# Patient Record
Sex: Male | Born: 1970 | Race: White | Hispanic: No | Marital: Married | Smoking: Current every day smoker
Health system: Southern US, Community
[De-identification: ages and names within clinical notes are randomized; demographics above are authoritative.]

## PROBLEM LIST (undated history)

## (undated) DIAGNOSIS — K2100 Gastro-esophageal reflux disease with esophagitis, without bleeding: Secondary | ICD-10-CM

---

## 1997-11-11 ENCOUNTER — Ambulatory Visit (HOSPITAL_COMMUNITY): Admission: RE | Admit: 1997-11-11 | Discharge: 1997-11-11 | Payer: Self-pay | Admitting: Internal Medicine

## 2008-01-28 ENCOUNTER — Emergency Department (HOSPITAL_BASED_OUTPATIENT_CLINIC_OR_DEPARTMENT_OTHER): Admission: EM | Admit: 2008-01-28 | Discharge: 2008-01-28 | Payer: Self-pay | Admitting: Emergency Medicine

## 2008-01-30 ENCOUNTER — Emergency Department (HOSPITAL_BASED_OUTPATIENT_CLINIC_OR_DEPARTMENT_OTHER): Admission: EM | Admit: 2008-01-30 | Discharge: 2008-01-30 | Payer: Self-pay | Admitting: Emergency Medicine

## 2009-08-09 IMAGING — CR DG NASAL BONES 3+V
3 series · 3 of 3 positions shown · non-contrast
Comparison: None

CLINICAL DATA: Hit in nose with baseball

NASAL BONES - 3+ VIEW

[w waters *]
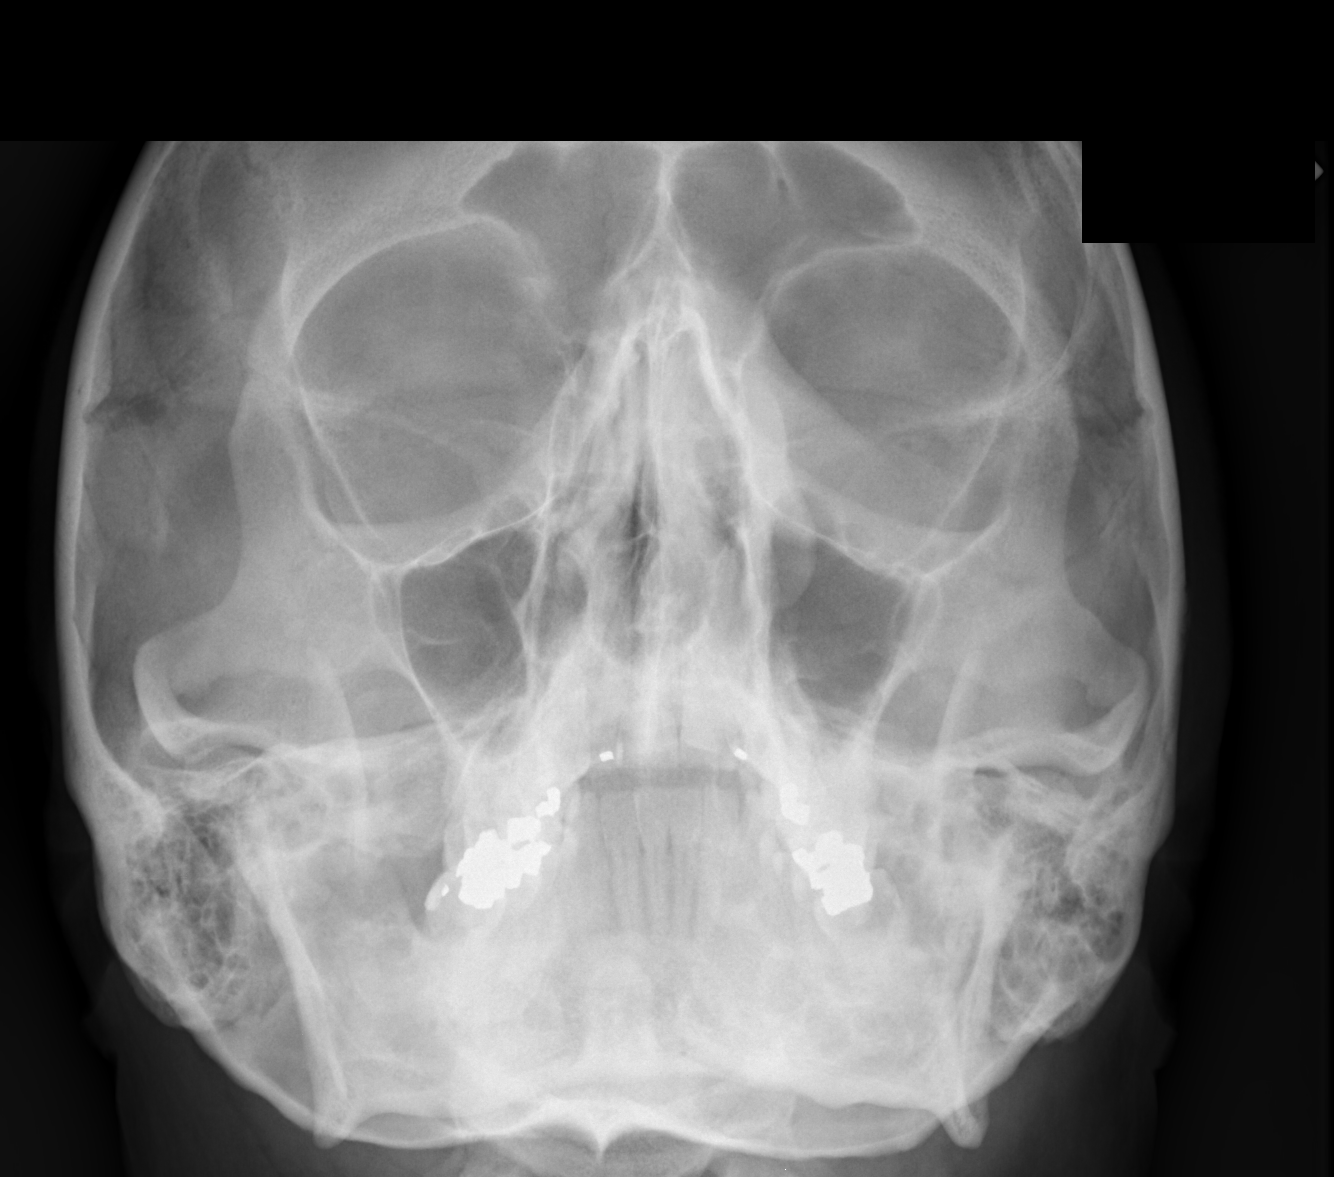

[w nasal bone lat * (1 of 2)]
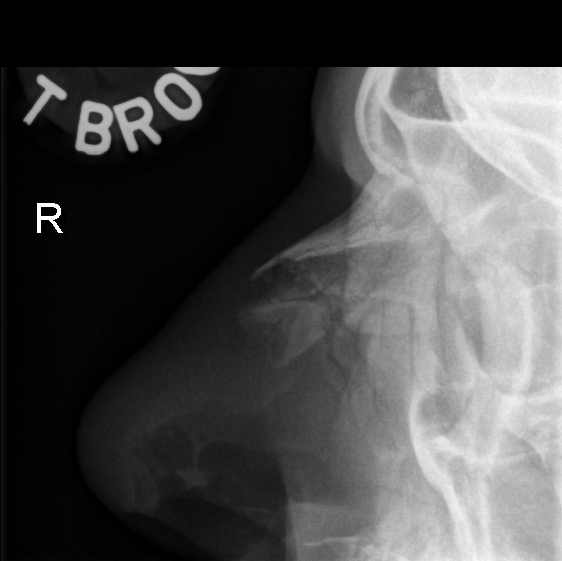

[w nasal bone lat * (2 of 2)]
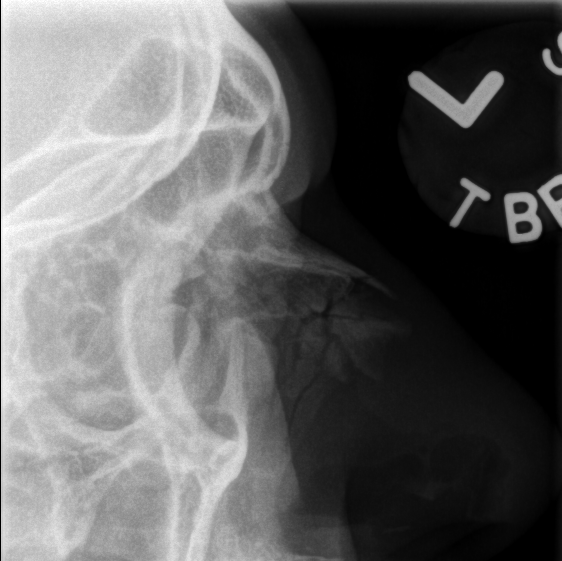

[3 of 3 positions shown; findings below may reference images not displayed]

FINDINGS: Linear lucency crosses the lower aspect of the nasal
bone, possibly nondisplaced fracture.  Paranasal sinuses are clear.
No evidence of orbital emphysema.
IMPRESSION: Possible nondisplaced nasal bone fracture.

## 2013-10-18 ENCOUNTER — Emergency Department (HOSPITAL_COMMUNITY): Payer: BC Managed Care – PPO

## 2013-10-18 ENCOUNTER — Encounter (HOSPITAL_COMMUNITY): Payer: Self-pay | Admitting: Emergency Medicine

## 2013-10-18 ENCOUNTER — Observation Stay (HOSPITAL_COMMUNITY)
Admission: EM | Admit: 2013-10-18 | Discharge: 2013-10-18 | Disposition: A | Discharge status: Home / Self Care | Payer: BC Managed Care – PPO | Attending: Internal Medicine | Admitting: Internal Medicine

## 2013-10-18 DIAGNOSIS — R110 Nausea: Secondary | ICD-10-CM | POA: Insufficient documentation

## 2013-10-18 DIAGNOSIS — R079 Chest pain, unspecified: Principal | ICD-10-CM | POA: Diagnosis present

## 2013-10-18 DIAGNOSIS — F17200 Nicotine dependence, unspecified, uncomplicated: Secondary | ICD-10-CM | POA: Insufficient documentation

## 2013-10-18 DIAGNOSIS — I4949 Other premature depolarization: Secondary | ICD-10-CM | POA: Insufficient documentation

## 2013-10-18 LAB — COMPREHENSIVE METABOLIC PANEL
ALT: 24 U/L (ref 0–53)
AST: 21 U/L (ref 0–37)
Albumin: 5.1 g/dL (ref 3.5–5.2)
Alkaline Phosphatase: 68 U/L (ref 39–117)
BUN: 12 mg/dL (ref 6–23)
CALCIUM: 10.1 mg/dL (ref 8.4–10.5)
CO2: 22 meq/L (ref 19–32)
CREATININE: 0.7 mg/dL (ref 0.50–1.35)
Chloride: 102 mEq/L (ref 96–112)
GFR calc Af Amer: >90 mL/min (ref >90)
Glucose, Bld: 120 mg/dL — ABNORMAL HIGH (ref 70–99)
Potassium: 4 mEq/L (ref 3.7–5.3)
Sodium: 140 mEq/L (ref 137–147)
Total Bilirubin: 0.4 mg/dL (ref 0.3–1.2)
Total Protein: 8 g/dL (ref 6.0–8.3)

## 2013-10-18 LAB — CBC WITH DIFFERENTIAL/PLATELET
Basophils Absolute: 0 10*3/uL (ref 0.0–0.1)
Basophils Relative: 0 % (ref 0–1)
EOS ABS: 0 10*3/uL (ref 0.0–0.7)
Eosinophils Relative: 0 % (ref 0–5)
HCT: 46.6 % (ref 39.0–52.0)
Hemoglobin: 16.5 g/dL (ref 13.0–17.0)
LYMPHS ABS: 2.3 10*3/uL (ref 0.7–4.0)
LYMPHS PCT: 15 % (ref 12–46)
MCH: 30.4 pg (ref 26.0–34.0)
MCHC: 35.4 g/dL (ref 30.0–36.0)
MCV: 85.8 fL (ref 78.0–100.0)
MONOS PCT: 4 % (ref 3–12)
Monocytes Absolute: 0.6 10*3/uL (ref 0.1–1.0)
NEUTROS ABS: 12.7 10*3/uL — AB (ref 1.7–7.7)
NEUTROS PCT: 81 % — AB (ref 43–77)
PLATELETS: 247 10*3/uL (ref 150–400)
RBC: 5.43 MIL/uL (ref 4.22–5.81)
RDW: 12.3 % (ref 11.5–15.5)
WBC: 15.7 10*3/uL — AB (ref 4.0–10.5)

## 2013-10-18 LAB — RAPID URINE DRUG SCREEN, HOSP PERFORMED
Amphetamines: NOT DETECTED
BENZODIAZEPINES: NOT DETECTED
Barbiturates: NOT DETECTED
COCAINE: NOT DETECTED
OPIATES: NOT DETECTED
Tetrahydrocannabinol: POSITIVE — AB

## 2013-10-18 LAB — TROPONIN I: Troponin I: <0.3 ng/mL (ref <0.30)

## 2013-10-18 LAB — D-DIMER, QUANTITATIVE (NOT AT ARMC)

## 2013-10-18 LAB — I-STAT TROPONIN, ED: TROPONIN I, POC: 0 ng/mL (ref 0.00–0.08)

## 2013-10-18 MED ORDER — FAMOTIDINE 20 MG PO TABS
20.0000 mg | ORAL_TABLET | Freq: Once | ORAL | Status: AC
  Administered 2013-10-18: 20 mg via ORAL
  Filled 2013-10-18: qty 1

## 2013-10-18 MED ORDER — ONDANSETRON HCL 4 MG/2ML IJ SOLN
4.0000 mg | Freq: Once | INTRAMUSCULAR | Status: AC
  Administered 2013-10-18: 4 mg via INTRAVENOUS
  Filled 2013-10-18: qty 2

## 2013-10-18 MED ORDER — GI COCKTAIL ~~LOC~~
30.0000 mL | Freq: Once | ORAL | Status: AC
  Administered 2013-10-18: 30 mL via ORAL
  Filled 2013-10-18: qty 30

## 2013-10-18 MED ORDER — SODIUM CHLORIDE 0.9 % IV BOLUS (SEPSIS)
500.0000 mL | Freq: Once | INTRAVENOUS | Status: AC
  Administered 2013-10-18: 500 mL via INTRAVENOUS

## 2013-10-18 NOTE — ED Notes (Signed)
PT refusing to be admitted. Dr Ardyth HarpsHernandez paged. Informed pt could leave AMA. Pt verbalized understanding of leaving against medical advice. Wife with patient.

## 2013-10-18 NOTE — ED Notes (Signed)
Pt states woke up this morning feeling like he couldn't catch his breath; developed chest pressure x 2 hours pta; worse with inspiration; vomited this morning

## 2013-10-18 NOTE — Progress Notes (Signed)
Went to do nursing admission history. Pt states he is not being admitted and wants to leave. Informed pt's rn. Briscoe Burns. Carleane Larkyn Greenberger BSN, RN-BC Admissions RN  10/18/2013 4:40 PM

## 2013-10-18 NOTE — ED Notes (Signed)
Pt currently at bedside vomiting.

## 2013-10-18 NOTE — Progress Notes (Signed)
   CARE MANAGEMENT ED NOTE 10/18/2013  Patient:  Michael Frazier,Michael Frazier   Account Number:  [id number]  Date Initiated:  10/18/2013  Documentation initiated by:  Edd ArbourGIBBS,Jennell Janosik  Subjective/Objective Assessment:   43 yr old bcbs [place] pt states seen by Dr Lucky CowboyWilliam mckeown     Subjective/Objective Assessment Detail:     Action[facility]Plan[facility]   [facility]   Action/Plan Detail:   Anticipated DC Date:       Status Recommendation to Physician:   Result of Recommendation:    Other ED Services  Consult Working Plan    DC Planning Services  Other  Outpatient Services - Pt will follow up  PCP issues    Choice offered to / List presented to:            Status of service:  Completed, signed off  ED Comments:   ED Comments Detail:

## 2013-10-18 NOTE — ED Provider Notes (Signed)
CSN: [id number]     Arrival date & time 10/18/13  1209 History   First MD Initiated Contact with Patient 10/18/13 1244     Chief Complaint  Patient presents with  . Chest Pain     (Consider location/radiation/quality/duration/timing/severity/associated sxs/prior Treatment) Patient is a 43 y.o. male presenting with chest pain. The history is provided by the patient.  Chest Pain Associated symptoms: cough   Associated symptoms: no abdominal pain, no back pain, no fever, no headache, no palpitations, no shortness of breath and not vomiting   pt awoke this am and felt pain mid chest, midline, lower sternal area. Constant. Dull. Non radiating. At times sl worse w full breath in or out. Denies heartburn. No specific exacerbating or alleviating factors. No change w position or activity level. +sob. No nv or diaphoresis. Denies hx same/similar symptoms in past. No other recent chest pain or discomfort even w exertion. No fam hx premature cad. Pt w rare non prod cough. Smoker. No hx htn, no high chol. No cocaine abuse. No fever or chills. Pt denies leg pain or swelling. No hx dvt or pe. No recent surgery, immobility, trauma.  Denies hx gerd. No hx gallstones.      History reviewed. No pertinent past medical history. History reviewed. No pertinent past surgical history. No family history on file. History  Substance Use Topics  . Smoking status: Current Every Day Smoker    Types: Cigarettes  . Smokeless tobacco: Not on file  . Alcohol Use: No    Review of Systems  Constitutional: Negative for fever and chills.  HENT: Negative for sore throat.   Eyes: Negative for redness.  Respiratory: Positive for cough. Negative for shortness of breath.   Cardiovascular: Positive for chest pain. Negative for palpitations and leg swelling.  Gastrointestinal: Negative for vomiting, abdominal pain and diarrhea.  Genitourinary: Negative for dysuria and flank pain.  Musculoskeletal: Negative for back pain and  neck pain.  Skin: Negative for rash.  Neurological: Negative for headaches.  Hematological: Does not bruise/bleed easily.  Psychiatric/Behavioral: Negative for confusion.      Allergies  Review of patient's allergies indicates no known allergies.  Home Medications  No current outpatient prescriptions on file. BP 135/86  Pulse 58  Temp(Src) 97.9 F (36.6 C) (Oral)  Resp 21  SpO2 100% Physical Exam  Nursing note and vitals reviewed. Constitutional: He is oriented to person, place, and time. He appears well-developed and well-nourished. No distress.  HENT:  Head: Atraumatic.  Eyes: Conjunctivae are normal. No scleral icterus.  Neck: Neck supple. No JVD present. No tracheal deviation present.  Cardiovascular: Normal rate, regular rhythm, normal heart sounds and intact distal pulses.  Exam reveals no gallop and no friction rub.   No murmur heard. Pulmonary/Chest: Effort normal and breath sounds normal. No accessory muscle usage. No respiratory distress. He exhibits no tenderness.  Abdominal: Soft. Bowel sounds are normal. He exhibits no distension and no mass. There is no tenderness. There is no rebound and no guarding.  Genitourinary:  No cva tenderness  Musculoskeletal: Normal range of motion. He exhibits no edema and no tenderness.  Neurological: He is alert and oriented to person, place, and time.  Skin: Skin is warm and dry.  Psychiatric: He has a normal mood and affect.    ED Course  Procedures (including critical care time)  Results for orders placed during the hospital encounter of 10/18/13  COMPREHENSIVE METABOLIC PANEL      Result Value Ref Range  Sodium 140  137 - 147 mEq/L   Potassium 4.0  3.7 - 5.3 mEq/L   Chloride 102  96 - 112 mEq/L   CO2 22  19 - 32 mEq/L   Glucose, Bld 120 (*) 70 - 99 mg/dL   BUN 12  6 - 23 mg/dL   Creatinine, Ser 1.61  0.50 - 1.35 mg/dL   Calcium 09.6  8.4 - 04.5 mg/dL   Total Protein 8.0  6.0 - 8.3 g/dL   Albumin 5.1  3.5 - 5.2  g/dL   AST 21  0 - 37 U/L   ALT 24  0 - 53 U/L   Alkaline Phosphatase 68  39 - 117 U/L   Total Bilirubin 0.4  0.3 - 1.2 mg/dL   GFR calc non Af Amer >90  >90 mL/min   GFR calc Af Amer >90  >90 mL/min  CBC WITH DIFFERENTIAL      Result Value Ref Range   WBC 15.7 (*) 4.0 - 10.5 K/uL   RBC 5.43  4.22 - 5.81 MIL/uL   Hemoglobin 16.5  13.0 - 17.0 g/dL   HCT 40.9  81.1 - 91.4 %   MCV 85.8  78.0 - 100.0 fL   MCH 30.4  26.0 - 34.0 pg   MCHC 35.4  30.0 - 36.0 g/dL   RDW 78.2  95.6 - 21.3 %   Platelets 247  150 - 400 K/uL   Neutrophils Relative % 81 (*) 43 - 77 %   Neutro Abs 12.7 (*) 1.7 - 7.7 K/uL   Lymphocytes Relative 15  12 - 46 %   Lymphs Abs 2.3  0.7 - 4.0 K/uL   Monocytes Relative 4  3 - 12 %   Monocytes Absolute 0.6  0.1 - 1.0 K/uL   Eosinophils Relative 0  0 - 5 %   Eosinophils Absolute 0.0  0.0 - 0.7 K/uL   Basophils Relative 0  0 - 1 %   Basophils Absolute 0.0  0.0 - 0.1 K/uL  D-DIMER, QUANTITATIVE      Result Value Ref Range   D-Dimer, Quant <0.27  0.00 - 0.48 ug/mL-FEU  URINE RAPID DRUG SCREEN (HOSP PERFORMED)      Result Value Ref Range   Opiates NONE DETECTED  NONE DETECTED   Cocaine NONE DETECTED  NONE DETECTED   Benzodiazepines NONE DETECTED  NONE DETECTED   Amphetamines NONE DETECTED  NONE DETECTED   Tetrahydrocannabinol POSITIVE (*) NONE DETECTED   Barbiturates NONE DETECTED  NONE DETECTED  TROPONIN I      Result Value Ref Range   Troponin I <0.30  <0.30 ng/mL  I-STAT TROPOININ, ED      Result Value Ref Range   Troponin i, poc 0.00  0.00 - 0.08 ng/mL   Comment 3            Dg Chest 2 View  10/18/2013   CLINICAL DATA:  Chest pain, history of tobacco use  EXAM: CHEST  2 VIEW  COMPARISON:  None.  FINDINGS: The lungs are adequately inflated. There is no focal infiltrate. The cardiac silhouette is normal in size. The pulmonary vascularity is not engorged. The mediastinum is normal in width. There is no pleural effusion or pneumothorax. The observed portions of the  bony thorax exhibit no acute abnormalities.  IMPRESSION: There is no evidence of pneumonia nor CHF or other active cardiopulmonary disease.   Electronically Signed   By: Christoher  [place]   On: 10/18/2013 13:35  EKG Interpretation   Date/Time:  Monday October 18 2013 12:13:31 EDT Ventricular Rate:  89 PR Interval:  155 QRS Duration: 93 QT Interval:  390 QTC Calculation: 474 R Axis:   -8 Text Interpretation:  Sinus arrhythmia Ventricular premature complex  Baseline wander in lead(s) III aVL No previous tracing Confirmed by Denton LankSTEINL   MD, Caryn BeeKEVIN ([id number]) on 10/18/2013 12:23:29 PM      MDM  Iv ns. Labs. Cxr. Ecg.  Reviewed nursing notes and prior charts for additional history.   0112 repeat ecg done, no acute st/t changes as compared to prior.  zofran for nausea. Gi cocktail and pepcid tried for symptom relief.  Awaiting labs.   No relief w gi meds. Intermittent cp/discomfort on recheck.  Repeat ecg without acute change from prior.  Repeat troponin pending.   Given new onset, recurrent cp, will admit.  Discussed w hospitalist, will do temp orders to observation, tele.   Recheck, pt afeb. Chest cta. abd soft nt. No chest wall tenderness.        Suzi RootsKevin E Garlon Tuggle, MD 10/18/13 [phone]

## 2015-04-30 IMAGING — CR DG CHEST 2V
2 series · 2 of 2 positions shown · non-contrast
Comparison: None.

CLINICAL DATA: Chest pain, history of tobacco use

EXAM:
CHEST  2 VIEW

[w chest pa]
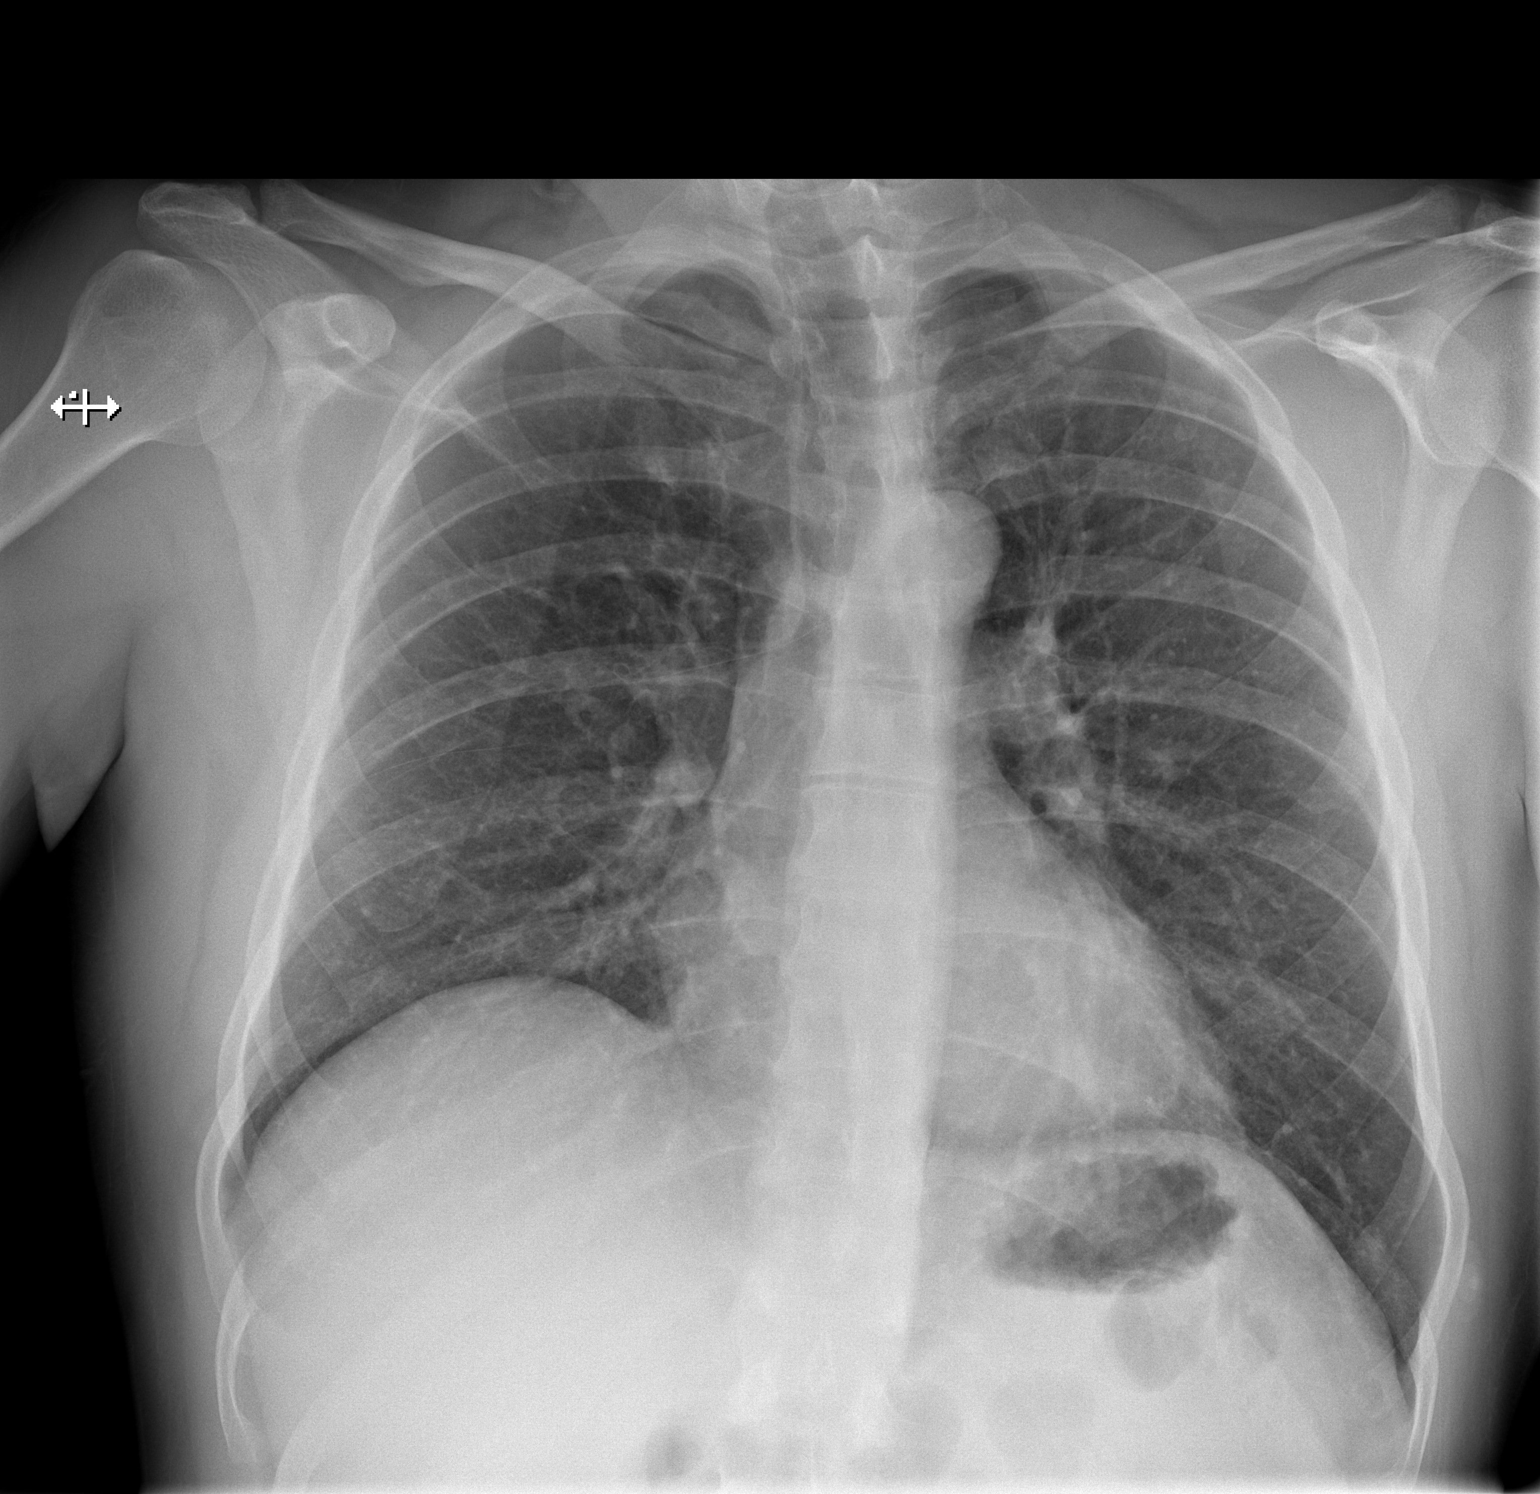

[w chest lat]
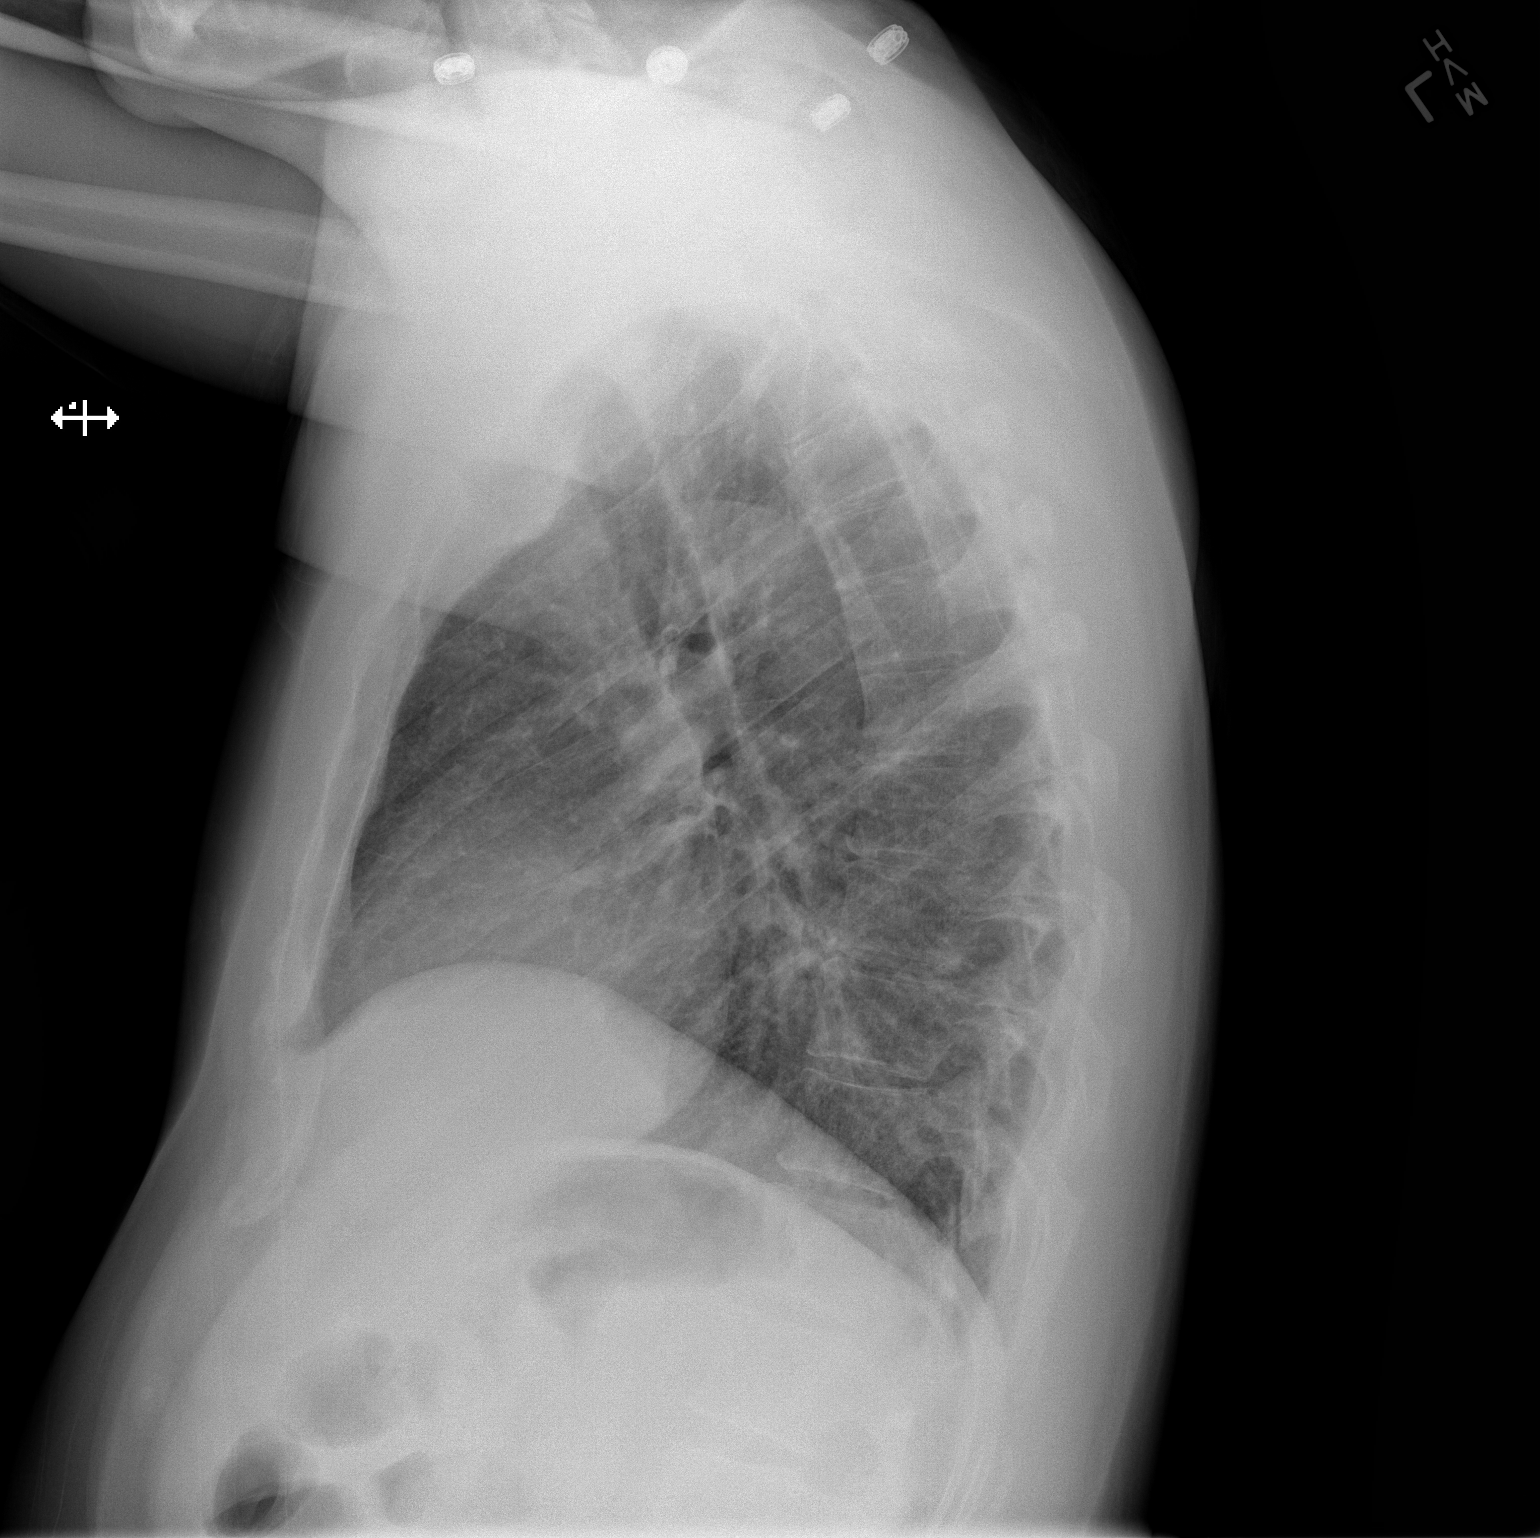

[2 of 2 positions shown; findings below may reference images not displayed]

FINDINGS: The lungs are adequately inflated. There is no focal infiltrate. The
cardiac silhouette is normal in size. The pulmonary vascularity is
not engorged. The mediastinum is normal in width. There is no
pleural effusion or pneumothorax. The observed portions of the bony
thorax exhibit no acute abnormalities.
IMPRESSION: There is no evidence of pneumonia nor CHF or other active
cardiopulmonary disease.

## 2017-08-24 NOTE — Progress Notes (Deleted)
Encounter to reestablish care and acute concern  Assessment and Plan:  Diagnoses and all orders for this visit:  Encounter for medical examination to establish care  Medication management -     CBC with Differential/Platelet -     BASIC METABOLIC PANEL WITH GFR -     Hepatic function panel -     Urinalysis w microscopic + reflex cultur     Discussed med's effects and SE's. Screening labs and tests as requested with regular follow-up as recommended. Over 30 minutes of exam, counseling, chart review and critical decision making was performed  Future Appointments  Date Time Provider Department Center  08/25/2017 11:00 AM Judd Gaudierorbett, Tasheem Elms, NP [facility] None     HPI There were no vitals taken for this visit.  47 year old male previously established at this practice returns after a 3 year hiatus to re-establish care and for concerns regarding a "lump" in his groin.   Needs CPE?  Current Medications:  No current outpatient medications on file prior to visit.   No current facility-administered medications on file prior to visit.    Allergies:  No Known Allergies Health Maintenance:   There is no immunization history on file for this patient.   Patient Care Team: Lucky CowboyMcKeown, William, MD as PCP - General (Internal Medicine)  Medical History:  has Chest pain on their problem list. Surgical History:  He  has no past surgical history on file. Family History:  His family history is not on file. Social History:   reports that he has been smoking cigarettes.  He does not have any smokeless tobacco history on file. He reports that he uses drugs. Drug: Marijuana. He reports that he does not drink alcohol. Review of Systems:  ROS  Physical Exam: There is no height or weight on file to calculate BMI. There were no vitals taken for this visit. General Appearance: Well nourished, in no apparent distress.  Eyes: PERRLA, EOMs, conjunctiva no swelling or erythema, normal fundi and  vessels.  Sinuses: No Frontal/maxillary tenderness  ENT/Mouth: Ext aud canals clear, normal light reflex with TMs without erythema, bulging. Good dentition. No erythema, swelling, or exudate on post pharynx. Tonsils not swollen or erythematous. Hearing normal.  Neck: Supple, thyroid normal. No bruits  Respiratory: Respiratory effort normal, BS equal bilaterally without rales, rhonchi, wheezing or stridor.  Cardio: RRR without murmurs, rubs or gallops. Brisk peripheral pulses without edema.  Chest: symmetric, with normal excursions and percussion.  Abdomen: Soft, nontender, no guarding, rebound, hernias, masses, or organomegaly.  Lymphatics: Non tender without lymphadenopathy.  Genitourinary:  Musculoskeletal: Full ROM all peripheral extremities,5/5 strength, and normal gait.  Skin: Warm, dry without rashes, lesions, ecchymosis. Neuro: Cranial nerves intact, reflexes equal bilaterally. Normal muscle tone, no cerebellar symptoms. Sensation intact.  Psych: Awake and oriented X 3, normal affect, Insight and Judgment appropriate.   Carlyon ShadowAshley C Rekha Hobbins 3:03 PM [facility]

## 2017-08-25 ENCOUNTER — Ambulatory Visit: Payer: Self-pay | Admitting: Adult Health

## 2017-08-31 NOTE — Progress Notes (Deleted)
Encounter to reestablish care and acute concern  Assessment and Plan:  Diagnoses and all orders for this visit:  Encounter to establish care with new doctor  Medication management     Discussed med's effects and SE's. Screening labs and tests as requested with regular follow-up as recommended. Over 30 minutes of exam, counseling, chart review and critical decision making was performed  Future Appointments  Date Time Provider Department Center  09/01/2017 11:15 AM Judd Gaudierorbett, Jerod Mcquain, NP [facility] None     HPI There were no vitals taken for this visit.  47 year old male previously established at this practice returns after a 3 year hiatus to re-establish care and for concerns regarding a "lump" in his groin.   Needs CPE? Cash pay?  Current Medications:  No current outpatient medications on file prior to visit.   No current facility-administered medications on file prior to visit.    Allergies:  No Known Allergies Health Maintenance:   There is no immunization history on file for this patient.   Patient Care Team: Lucky CowboyMcKeown, William, MD as PCP - General (Internal Medicine)  Medical History:  has Chest pain on their problem list. Surgical History:  He  has no past surgical history on file. Family History:  His family history is not on file. Social History:   reports that he has been smoking cigarettes.  He does not have any smokeless tobacco history on file. He reports that he uses drugs. Drug: Marijuana. He reports that he does not drink alcohol. Review of Systems:  Review of Systems  Constitutional: Negative for malaise/fatigue and weight loss.  HENT: Negative for hearing loss and tinnitus.   Eyes: Negative for blurred vision and double vision.  Respiratory: Negative for cough, shortness of breath and wheezing.   Cardiovascular: Negative for chest pain, palpitations, orthopnea, claudication and leg swelling.  Gastrointestinal: Negative for abdominal pain, blood in  stool, constipation, diarrhea, heartburn, melena, nausea and vomiting.  Genitourinary: Negative.   Musculoskeletal: Negative for joint pain and myalgias.  Skin: Negative for rash.  Neurological: Negative for dizziness, tingling, sensory change, weakness and headaches.  Endo/Heme/Allergies: Negative for polydipsia.  Psychiatric/Behavioral: Negative.   All other systems reviewed and are negative.   Physical Exam: There is no height or weight on file to calculate BMI. There were no vitals taken for this visit. General Appearance: Well nourished, in no apparent distress.  Eyes: PERRLA, EOMs, conjunctiva no swelling or erythema, normal fundi and vessels.  Sinuses: No Frontal/maxillary tenderness  ENT/Mouth: Ext aud canals clear, normal light reflex with TMs without erythema, bulging. Good dentition. No erythema, swelling, or exudate on post pharynx. Tonsils not swollen or erythematous. Hearing normal.  Neck: Supple, thyroid normal. No bruits  Respiratory: Respiratory effort normal, BS equal bilaterally without rales, rhonchi, wheezing or stridor.  Cardio: RRR without murmurs, rubs or gallops. Brisk peripheral pulses without edema.  Chest: symmetric, with normal excursions and percussion.  Abdomen: Soft, nontender, no guarding, rebound, hernias, masses, or organomegaly.  Lymphatics: Non tender without lymphadenopathy.  Genitourinary:  Musculoskeletal: Full ROM all peripheral extremities,5/5 strength, and normal gait.  Skin: Warm, dry without rashes, lesions, ecchymosis. Neuro: Cranial nerves intact, reflexes equal bilaterally. Normal muscle tone, no cerebellar symptoms. Sensation intact.  Psych: Awake and oriented X 3, normal affect, Insight and Judgment appropriate.   Carlyon ShadowAshley C Gwenlyn Hottinger 2:54 PM [facility]

## 2017-09-01 ENCOUNTER — Ambulatory Visit: Payer: Self-pay | Admitting: Adult Health

## 2019-08-01 ENCOUNTER — Other Ambulatory Visit: Payer: Self-pay

## 2019-08-01 ENCOUNTER — Encounter (HOSPITAL_BASED_OUTPATIENT_CLINIC_OR_DEPARTMENT_OTHER): Payer: Self-pay | Admitting: *Deleted

## 2019-08-01 ENCOUNTER — Emergency Department (HOSPITAL_BASED_OUTPATIENT_CLINIC_OR_DEPARTMENT_OTHER): Payer: BC Managed Care – PPO

## 2019-08-01 ENCOUNTER — Emergency Department (HOSPITAL_BASED_OUTPATIENT_CLINIC_OR_DEPARTMENT_OTHER)
Admission: EM | Admit: 2019-08-01 | Discharge: 2019-08-01 | Disposition: A | Discharge status: Home / Self Care | Payer: BC Managed Care – PPO | Attending: Emergency Medicine | Admitting: Emergency Medicine

## 2019-08-01 DIAGNOSIS — R140 Abdominal distension (gaseous): Secondary | ICD-10-CM | POA: Insufficient documentation

## 2019-08-01 DIAGNOSIS — R1013 Epigastric pain: Secondary | ICD-10-CM | POA: Insufficient documentation

## 2019-08-01 DIAGNOSIS — R079 Chest pain, unspecified: Secondary | ICD-10-CM | POA: Diagnosis present

## 2019-08-01 DIAGNOSIS — F17210 Nicotine dependence, cigarettes, uncomplicated: Secondary | ICD-10-CM | POA: Diagnosis not present

## 2019-08-01 DIAGNOSIS — R072 Precordial pain: Secondary | ICD-10-CM | POA: Diagnosis not present

## 2019-08-01 DIAGNOSIS — R142 Eructation: Secondary | ICD-10-CM | POA: Diagnosis not present

## 2019-08-01 DIAGNOSIS — R0602 Shortness of breath: Secondary | ICD-10-CM | POA: Insufficient documentation

## 2019-08-01 DIAGNOSIS — R112 Nausea with vomiting, unspecified: Secondary | ICD-10-CM | POA: Insufficient documentation

## 2019-08-01 LAB — CBC WITH DIFFERENTIAL/PLATELET
Abs Immature Granulocytes: 0.04 10*3/uL (ref 0.00–0.07)
Basophils Absolute: 0 10*3/uL (ref 0.0–0.1)
Basophils Relative: 0 %
Eosinophils Absolute: 0 10*3/uL (ref 0.0–0.5)
Eosinophils Relative: 0 %
HCT: 48.9 % (ref 39.0–52.0)
Hemoglobin: 16.7 g/dL (ref 13.0–17.0)
Immature Granulocytes: 0 %
Lymphocytes Relative: 14 %
Lymphs Abs: 1.8 10*3/uL (ref 0.7–4.0)
MCH: 30.5 pg (ref 26.0–34.0)
MCHC: 34.2 g/dL (ref 30.0–36.0)
MCV: 89.4 fL (ref 80.0–100.0)
Monocytes Absolute: 0.7 10*3/uL (ref 0.1–1.0)
Monocytes Relative: 5 %
Neutro Abs: 10.5 10*3/uL — ABNORMAL HIGH (ref 1.7–7.7)
Neutrophils Relative %: 81 %
Platelets: 262 10*3/uL (ref 150–400)
RBC: 5.47 MIL/uL (ref 4.22–5.81)
RDW: 12 % (ref 11.5–15.5)
WBC: 13 10*3/uL — ABNORMAL HIGH (ref 4.0–10.5)
nRBC: 0 % (ref 0.0–0.2)

## 2019-08-01 LAB — TROPONIN I (HIGH SENSITIVITY)
Troponin I (High Sensitivity): 3 ng/L (ref <18)
Troponin I (High Sensitivity): 3 ng/L (ref <18)

## 2019-08-01 LAB — BASIC METABOLIC PANEL
Anion gap: 14 (ref 5–15)
BUN: 16 mg/dL (ref 6–20)
CO2: 21 mmol/L — ABNORMAL LOW (ref 22–32)
Calcium: 9.4 mg/dL (ref 8.9–10.3)
Chloride: 100 mmol/L (ref 98–111)
Creatinine, Ser: 0.81 mg/dL (ref 0.61–1.24)
GFR calc Af Amer: >60 mL/min (ref >60)
GFR calc non Af Amer: >60 mL/min (ref >60)
Glucose, Bld: 142 mg/dL — ABNORMAL HIGH (ref 70–99)
Potassium: 3.4 mmol/L — ABNORMAL LOW (ref 3.5–5.1)
Sodium: 135 mmol/L (ref 135–145)

## 2019-08-01 LAB — D-DIMER, QUANTITATIVE: D-Dimer, Quant: <0.27 ug/mL-FEU (ref 0.00–0.50)

## 2019-08-01 MED ORDER — KETOROLAC TROMETHAMINE 30 MG/ML IJ SOLN
15.0000 mg | Freq: Once | INTRAMUSCULAR | Status: DC
  Filled 2019-08-01: qty 1

## 2019-08-01 MED ORDER — ASPIRIN 81 MG PO CHEW
324.0000 mg | CHEWABLE_TABLET | Freq: Once | ORAL | Status: AC
  Administered 2019-08-01: 324 mg via ORAL
  Filled 2019-08-01: qty 4

## 2019-08-01 MED ORDER — LIDOCAINE VISCOUS HCL 2 % MT SOLN
15.0000 mL | Freq: Once | OROMUCOSAL | Status: AC
  Administered 2019-08-01: 15 mL via ORAL
  Filled 2019-08-01: qty 15

## 2019-08-01 MED ORDER — ALUM & MAG HYDROXIDE-SIMETH 200-200-20 MG/5ML PO SUSP
30.0000 mL | Freq: Once | ORAL | Status: AC
  Administered 2019-08-01: 30 mL via ORAL
  Filled 2019-08-01: qty 30

## 2019-08-01 MED ORDER — OMEPRAZOLE 20 MG PO CPDR: 20.0000 mg | DELAYED_RELEASE_CAPSULE | Freq: Every day | ORAL | 0 refills | Status: AC

## 2019-08-01 MED ORDER — ONDANSETRON HCL 4 MG/2ML IJ SOLN
4.0000 mg | Freq: Once | INTRAMUSCULAR | Status: AC
  Administered 2019-08-01: 4 mg via INTRAVENOUS
  Filled 2019-08-01: qty 2

## 2019-08-01 NOTE — ED Notes (Signed)
Notified lab regarding ddimer order

## 2019-08-01 NOTE — ED Provider Notes (Signed)
[facility] EMERGENCY DEPARTMENT Provider Note   CSN: [id number] Arrival date & time: 08/01/19  0522     History Chief Complaint  Patient presents with  . Chest Pain    Michael Frazier is a 49 y.o. male.  The history is provided by the patient.  Chest Pain Pain location:  Epigastric Pain quality: not dull   Pain radiates to:  Does not radiate Pain severity:  Severe Onset quality:  Gradual Timing:  Intermittent Progression:  Worsening Chronicity:  New Context: at rest   Relieved by:  Nothing Worsened by:  Nothing Ineffective treatments:  None tried Associated symptoms: nausea and vomiting   Associated symptoms: no altered mental status, no anorexia, no back pain, no claudication, no cough, no diaphoresis, no dizziness, no dysphagia, no fatigue, no fever, no numbness and no palpitations   Associated symptoms comment:  Belching with nausea and vomiting and sob when episodes occur.  The current episode has been constant for 15 hours as it started at 2 pm post sausage biscuit.   Risk factors: male sex   Risk factors: no coronary artery disease, no diabetes mellitus, no high cholesterol, no hypertension, not obese, no prior DVT/PE and no surgery        History reviewed. No pertinent past medical history.  Patient Active Problem List   Diagnosis Date Noted  . Chest pain 10/18/2013    History reviewed. No pertinent surgical history.     History reviewed. No pertinent family history.  Social History   Tobacco Use  . Smoking status: Current Every Day Smoker    Types: Cigarettes  Substance Use Topics  . Alcohol use: No  . Drug use: Yes    Types: Marijuana    Home Medications Prior to Admission medications   Medication Sig Start Date End Date Taking? Authorizing Provider  omeprazole (PRILOSEC) 20 MG capsule Take 1 capsule (20 mg total) by mouth daily. 08/01/19   Jahleel Stroschein, MD    Allergies    Patient has no known allergies.  Review of Systems    Review of Systems  Constitutional: Negative for diaphoresis, fatigue and fever.  HENT: Negative for trouble swallowing.   Respiratory: Negative for cough.   Cardiovascular: Positive for chest pain. Negative for palpitations, claudication and leg swelling.  Gastrointestinal: Positive for nausea and vomiting. Negative for anorexia.       Belching   Genitourinary: Negative for difficulty urinating.  Musculoskeletal: Negative for back pain.  Neurological: Negative for dizziness and numbness.  Psychiatric/Behavioral: Negative for agitation.  All other systems reviewed and are negative.   Physical Exam Updated Vital Signs BP (!) 149/81   Pulse (!) 58   Temp 98.3 F (36.8 C) (Oral)   Resp 16   Ht 5\' 3"  (1.6 m)   Wt 72.6 kg   SpO2 100%   BMI 28.34 kg/m   Physical Exam Vitals and nursing note reviewed.  Constitutional:      General: He is not in acute distress.    Appearance: He is normal weight. He is not diaphoretic.  HENT:     Head: Normocephalic and atraumatic.     Nose: Nose normal.  Eyes:     Conjunctiva/sclera: Conjunctivae normal.     Pupils: Pupils are equal, round, and reactive to light.  Cardiovascular:     Rate and Rhythm: Normal rate and regular rhythm.     Pulses: Normal pulses.     Heart sounds: Normal heart sounds.  Pulmonary:  Effort: Pulmonary effort is normal.     Breath sounds: Normal breath sounds.  Abdominal:     General: Abdomen is flat.     Palpations: Abdomen is soft.     Tenderness: There is no abdominal tenderness. There is no guarding or rebound. Negative signs include Murphy's sign, McBurney's sign and psoas sign.     Comments: Gassy in the upper abdomen  Musculoskeletal:        General: Normal range of motion.     Cervical back: Normal range of motion and neck supple.  Skin:    General: Skin is warm and dry.     Capillary Refill: Capillary refill takes less than 2 seconds.  Neurological:     General: No focal deficit present.      Mental Status: He is alert and oriented to person, place, and time.     Deep Tendon Reflexes: Reflexes normal.  Psychiatric:        Mood and Affect: Mood normal.        Behavior: Behavior normal.     ED Results / Procedures / Treatments   Labs (all labs ordered are listed, but only abnormal results are displayed) Results for orders placed or performed during the hospital encounter of 08/01/19  CBC with Differential  Result Value Ref Range   WBC 13.0 (H) 4.0 - 10.5 K/uL   RBC 5.47 4.22 - 5.81 MIL/uL   Hemoglobin 16.7 13.0 - 17.0 g/dL   HCT 48.9 39.0 - 52.0 %   MCV 89.4 80.0 - 100.0 fL   MCH 30.5 26.0 - 34.0 pg   MCHC 34.2 30.0 - 36.0 g/dL   RDW 12.0 11.5 - 15.5 %   Platelets 262 150 - 400 K/uL   nRBC 0.0 0.0 - 0.2 %   Neutrophils Relative % 81 %   Neutro Abs 10.5 (H) 1.7 - 7.7 K/uL   Lymphocytes Relative 14 %   Lymphs Abs 1.8 0.7 - 4.0 K/uL   Monocytes Relative 5 %   Monocytes Absolute 0.7 0.1 - 1.0 K/uL   Eosinophils Relative 0 %   Eosinophils Absolute 0.0 0.0 - 0.5 K/uL   Basophils Relative 0 %   Basophils Absolute 0.0 0.0 - 0.1 K/uL   Immature Granulocytes 0 %   Abs Immature Granulocytes 0.04 0.00 - 0.07 K/uL  Basic metabolic panel  Result Value Ref Range   Sodium 135 135 - 145 mmol/L   Potassium 3.4 (L) 3.5 - 5.1 mmol/L   Chloride 100 98 - 111 mmol/L   CO2 21 (L) 22 - 32 mmol/L   Glucose, Bld 142 (H) 70 - 99 mg/dL   BUN 16 6 - 20 mg/dL   Creatinine, Ser 0.81 0.61 - 1.24 mg/dL   Calcium 9.4 8.9 - 10.3 mg/dL   GFR calc non Af Amer >60 >60 mL/min   GFR calc Af Amer >60 >60 mL/min   Anion gap 14 5 - 15  D-dimer, quantitative (not at [facility])  Result Value Ref Range   D-Dimer, Quant <0.27 0.00 - 0.50 ug/mL-FEU  Troponin I (High Sensitivity)  Result Value Ref Range   Troponin I (High Sensitivity) 3 <18 ng/L   DG Chest Portable 1 View  Result Date: 08/01/2019 CLINICAL DATA:  Substernal chest pain with shortness of breath. EXAM: PORTABLE CHEST 1 VIEW COMPARISON:   10/18/2013 FINDINGS: 0542 hours. The lungs are clear without focal pneumonia, edema, pneumothorax or pleural effusion. The cardiopericardial silhouette is within normal limits for size. The visualized bony  structures of the thorax are intact. Telemetry leads overlie the chest. IMPRESSION: No active disease. Electronically Signed   By: Kennith Center M.D.   On: 08/01/2019 06:07    EKG EKG Interpretation  Date/Time:  Sunday August 01 2019 05:50:10 EST Ventricular Rate:  63 PR Interval:    QRS Duration: 83 QT Interval:  402 QTC Calculation: 412 R Axis:   8 Text Interpretation: Sinus or ectopic atrial rhythm Short PR interval I Confirmed by Relda Agosto ([id number]) on 08/01/2019 6:27:19 AM   Radiology DG Chest Portable 1 View  Result Date: 08/01/2019 CLINICAL DATA:  Substernal chest pain with shortness of breath. EXAM: PORTABLE CHEST 1 VIEW COMPARISON:  10/18/2013 FINDINGS: 0542 hours. The lungs are clear without focal pneumonia, edema, pneumothorax or pleural effusion. The cardiopericardial silhouette is within normal limits for size. The visualized bony structures of the thorax are intact. Telemetry leads overlie the chest. IMPRESSION: No active disease. Electronically Signed   By: Eric  Mansell M.D.   On: 08/01/2019 06:07    Procedures Procedures (including critical care time)  Medications Ordered in ED Medications  ketorolac (TORADOL) 30 MG/ML injection 15 mg (has no administration in time range)  alum & mag hydroxide-simeth (MAALOX/MYLANTA) 200-200-20 MG/5ML suspension 30 mL (30 mLs Oral Given 08/01/19 0549)    And  lidocaine (XYLOCAINE) 2 % viscous mouth solution 15 mL (15 mLs Oral Given 08/01/19 0549)  aspirin chewable tablet 324 mg (324 mg Oral Given 08/01/19 0549)  ondansetron (ZOFRAN) injection 4 mg (4 mg Intravenous Given 08/01/19 0549)    ED Course  I have reviewed the triage vital signs and the nursing notes.  Pertinent labs & imaging results that were available during my care  of the patient were reviewed by me and considered in my medical decision making (see chart for details).    Sleeping post medication.  I suspect this is GERD and have sent an RX to the pharmacy and have advised a GERD friendly diet.  Low risk for PE and ruled out with negative Ddimer.  Signed out to Dr. Zackowski pending second troponin.  Heart score is 1  Final Clinical Impression(s) / ED Diagnoses Final diagnoses:  Precordial pain   Signed out pending second troponin.  Refer to cardiology for stress test as an outpatient.    Rx / DC Orders ED Discharge Orders         Ordered    omeprazole (PRILOSEC) 20 MG capsule  Daily     01 /10/21 0628           Narcisa Ganesh, MD 08/01/19 09/29/19

## 2019-08-01 NOTE — Discharge Instructions (Addendum)
If you develop recurrent, continued, or worsening chest pain, shortness of breath, fever, vomiting, abdominal or back pain, or any other new/concerning symptoms then return to the ER for evaluation.  

## 2019-08-01 NOTE — ED Notes (Signed)
X RAY at bedside 

## 2019-08-01 NOTE — ED Triage Notes (Signed)
Pt reports substernal CP x 1 week with SOB, N/V.

## 2019-08-01 NOTE — ED Provider Notes (Signed)
Second troponin is negative.  Patient feels well.  Appears stable for discharge home.  Referred to cardiology.   Pricilla Loveless, MD 08/01/19 [phone]

## 2019-08-02 ENCOUNTER — Encounter (HOSPITAL_BASED_OUTPATIENT_CLINIC_OR_DEPARTMENT_OTHER): Payer: Self-pay | Admitting: Emergency Medicine

## 2019-08-02 ENCOUNTER — Emergency Department (HOSPITAL_BASED_OUTPATIENT_CLINIC_OR_DEPARTMENT_OTHER)
Admission: EM | Admit: 2019-08-02 | Discharge: 2019-08-02 | Disposition: A | Discharge status: Home / Self Care | Payer: BC Managed Care – PPO | Attending: Emergency Medicine | Admitting: Emergency Medicine

## 2019-08-02 DIAGNOSIS — F1210 Cannabis abuse, uncomplicated: Secondary | ICD-10-CM | POA: Insufficient documentation

## 2019-08-02 DIAGNOSIS — R110 Nausea: Secondary | ICD-10-CM | POA: Insufficient documentation

## 2019-08-02 DIAGNOSIS — R0789 Other chest pain: Secondary | ICD-10-CM | POA: Diagnosis present

## 2019-08-02 DIAGNOSIS — R142 Eructation: Secondary | ICD-10-CM | POA: Insufficient documentation

## 2019-08-02 DIAGNOSIS — R0602 Shortness of breath: Secondary | ICD-10-CM | POA: Insufficient documentation

## 2019-08-02 DIAGNOSIS — F17210 Nicotine dependence, cigarettes, uncomplicated: Secondary | ICD-10-CM | POA: Insufficient documentation

## 2019-08-02 DIAGNOSIS — K219 Gastro-esophageal reflux disease without esophagitis: Secondary | ICD-10-CM | POA: Diagnosis not present

## 2019-08-02 HISTORY — DX: Gastro-esophageal reflux disease with esophagitis, without bleeding: K21.00

## 2019-08-02 MED ORDER — SUCRALFATE 1 G PO TABS
1.0000 g | ORAL_TABLET | Freq: Once | ORAL | Status: AC
  Administered 2019-08-02: 1 g via ORAL
  Filled 2019-08-02: qty 1

## 2019-08-02 MED ORDER — ONDANSETRON 4 MG PO TBDP
4.0000 mg | ORAL_TABLET | Freq: Once | ORAL | Status: AC
  Administered 2019-08-02: 4 mg via ORAL
  Filled 2019-08-02: qty 1

## 2019-08-02 MED ORDER — ONDANSETRON 4 MG PO TBDP: 4.0000 mg | ORAL_TABLET | Freq: Three times a day (TID) | ORAL | 0 refills | Status: AC | PRN

## 2019-08-02 MED ORDER — SUCRALFATE 1 GM/10ML PO SUSP: 1.0000 g | Freq: Three times a day (TID) | ORAL | 0 refills | Status: AC

## 2019-08-02 MED ORDER — SUCRALFATE 1 GM/10ML PO SUSP
ORAL | Status: AC
  Filled 2019-08-02: qty 10

## 2019-08-02 MED ORDER — LIDOCAINE VISCOUS HCL 2 % MT SOLN
15.0000 mL | Freq: Once | OROMUCOSAL | Status: AC
  Administered 2019-08-02: 15 mL via ORAL
  Filled 2019-08-02: qty 15

## 2019-08-02 MED ORDER — ALUM & MAG HYDROXIDE-SIMETH 200-200-20 MG/5ML PO SUSP
15.0000 mL | Freq: Once | ORAL | Status: AC
  Administered 2019-08-02: 15 mL via ORAL
  Filled 2019-08-02: qty 30

## 2019-08-02 NOTE — ED Notes (Signed)
Received report

## 2019-08-02 NOTE — ED Notes (Signed)
ED Provider at bedside. 

## 2019-08-02 NOTE — Discharge Instructions (Addendum)
You were seen today for atypical chest pain.  Your work-up yesterday was very reassuring.  Today your EKG is unchanged.  Your symptoms are highly suggestive of reflux.  For the next 2 to 3 days, avoid eliciting food.  Try to set up upright and sleep propped up.  Take medications as prescribed.  Follow-up with gastroenterologist for formal endoscopy.

## 2019-08-02 NOTE — ED Provider Notes (Signed)
[facility] EMERGENCY DEPARTMENT Provider Note   CSN: [id number] Arrival date & time: 08/02/19  0358     History Chief Complaint  Patient presents with  . Shortness of Breath    Michael Frazier is a 49 y.o. male.  HPI     This is a 49 year old male with no reported past medical history who presents with persistent burning chest pain, shortness of breath, burping, and nausea.  He was seen and evaluated yesterday for the same.  At that time he had a full work-up including cardiac enzymes, EKG, chest x-ray, and D-dimer.  I have reviewed his work-up and it was negative.  He was prescribed Prilosec.  Patient reports he has had persistent symptoms at home.  It is worse when he lays flat and at night.  It is a burning sensation in his mid chest that does not radiate.  He reports associated burping and nausea.  No vomiting or diarrhea.  Denies abdominal pain.  Has noted some shortness of breath.  No recent fevers or cough.  He is a smoker.  Currently he rates his pain at 8 out of 10.  Past Medical History:  Diagnosis Date  . Reflux esophagitis     Patient Active Problem List   Diagnosis Date Noted  . Chest pain 10/18/2013    History reviewed. No pertinent surgical history.     No family history on file.  Social History   Tobacco Use  . Smoking status: Current Every Day Smoker    Types: Cigarettes  . Smokeless tobacco: Never Used  Substance Use Topics  . Alcohol use: No  . Drug use: Yes    Types: Marijuana    Home Medications Prior to Admission medications   Medication Sig Start Date End Date Taking? Authorizing Provider  acetaminophen (TYLENOL) 325 MG tablet Take by mouth.    [provider]  omeprazole (PRILOSEC) 20 MG capsule Take 1 capsule (20 mg total) by mouth daily. 08/01/19   Palumbo, April, MD  ondansetron (ZOFRAN ODT) 4 MG disintegrating tablet Take 1 tablet (4 mg total) by mouth every 8 (eight) hours as needed. 08/02/19   Montae Stager, Mayer Masker,  MD  sucralfate (CARAFATE) 1 GM/10ML suspension Take 10 mLs (1 g total) by mouth 4 (four) times daily -  with meals and at bedtime. 08/02/19   Darika Ildefonso, Mayer Masker, MD    Allergies    Patient has no known allergies.  Review of Systems   Review of Systems  Constitutional: Negative for fever.  Respiratory: Positive for shortness of breath. Negative for cough.   Cardiovascular: Positive for chest pain. Negative for leg swelling.  Gastrointestinal: Positive for nausea. Negative for abdominal pain, diarrhea and vomiting.  Genitourinary: Negative for dysuria.  Musculoskeletal: Negative for back pain.  All other systems reviewed and are negative.   Physical Exam Updated Vital Signs BP 134/87   Pulse (!) 58   Temp 97.8 F (36.6 C) (Oral)   Resp 12   SpO2 97%   Physical Exam Vitals and nursing note reviewed.  Constitutional:      Appearance: He is well-developed. He is not ill-appearing.  HENT:     Head: Normocephalic and atraumatic.  Eyes:     Pupils: Pupils are equal, round, and reactive to light.  Cardiovascular:     Rate and Rhythm: Normal rate and regular rhythm.     Heart sounds: Normal heart sounds. No murmur.  Pulmonary:     Effort: Pulmonary effort is  normal. No respiratory distress.     Breath sounds: Normal breath sounds. No wheezing.  Chest:     Chest wall: No tenderness.  Abdominal:     General: Bowel sounds are normal.     Palpations: Abdomen is soft.     Tenderness: There is no abdominal tenderness. There is no rebound.  Musculoskeletal:     Cervical back: Neck supple.     Right lower leg: No tenderness. No edema.     Left lower leg: No tenderness. No edema.  Lymphadenopathy:     Cervical: No cervical adenopathy.  Skin:    General: Skin is warm and dry.  Neurological:     Mental Status: He is alert and oriented to person, place, and time.  Psychiatric:        Mood and Affect: Mood normal.     ED Results / Procedures / Treatments   Labs (all labs  ordered are listed, but only abnormal results are displayed) Labs Reviewed - No data to display  EKG EKG Interpretation  Date/Time:  Monday August 02 2019 04:28:25 EST Ventricular Rate:  57 PR Interval:    QRS Duration: 86 QT Interval:  415 QTC Calculation: 404 R Axis:   47 Text Interpretation: Sinus rhythm Probable left atrial enlargement No significant change since last tracing Confirmed by Ross Marcus ([id number]) on 08/02/2019 4:33:41 AM   Radiology DG Chest Portable 1 View  Result Date: 08/01/2019 CLINICAL DATA:  Substernal chest pain with shortness of breath. EXAM: PORTABLE CHEST 1 VIEW COMPARISON:  10/18/2013 FINDINGS: 0542 hours. The lungs are clear without focal pneumonia, edema, pneumothorax or pleural effusion. The cardiopericardial silhouette is within normal limits for size. The visualized bony structures of the thorax are intact. Telemetry leads overlie the chest. IMPRESSION: No active disease. Electronically Signed   By: Kennith Center M.D.   On: 08/01/2019 06:07    Procedures Procedures (including critical care time)  Medications Ordered in ED Medications  alum & mag hydroxide-simeth (MAALOX/MYLANTA) 200-200-20 MG/5ML suspension 15 mL (15 mLs Oral Given 08/02/19 0433)  ondansetron (ZOFRAN-ODT) disintegrating tablet 4 mg (4 mg Oral Given 08/02/19 0504)  alum & mag hydroxide-simeth (MAALOX/MYLANTA) 200-200-20 MG/5ML suspension 15 mL (15 mLs Oral Given 08/02/19 0505)    And  lidocaine (XYLOCAINE) 2 % viscous mouth solution 15 mL (15 mLs Oral Given 08/02/19 0505)  sucralfate (CARAFATE) tablet 1 g (1 g Oral Given 08/02/19 0505)  sucralfate (CARAFATE) 1 GM/10ML suspension (  Given 08/02/19 0505)    ED Course  I have reviewed the triage vital signs and the nursing notes.  Pertinent labs & imaging results that were available during my care of the patient were reviewed by me and considered in my medical decision making (see chart for details).    MDM Rules/Calculators/A&P                        Patient presents with atypical chest pain, nausea, shortness of breath.  He is overall nontoxic.  Vital signs are reassuring.  He is afebrile.  Full review of his work-up yesterday with no evidence of ACS, PE.  Repeat EKG without evidence of acute ischemia and unchanged from prior.  History is highly suggestive of reflux especially given positional component, description, and associated nausea and burping.  Patient was given Maalox and Zofran initially.  We will hold off on further work-up pending his relief.  Patient was subsequently given viscous lidocaine, Carafate.  On recheck, he reports no  real change but no worsening.  He is able to tolerate fluids.  His abdomen is nontender.  Low suspicion for cholecystitis or pancreatitis.  Will recommend further supportive measures for reflux including addition of Carafate and Zofran.  Close GI follow-up recommended.  Patient was given strict return precautions.  After history, exam, and medical workup I feel the patient has been appropriately medically screened and is safe for discharge home. Pertinent diagnoses were discussed with the patient. Patient was given return precautions.   Final Clinical Impression(s) / ED Diagnoses Final diagnoses:  Atypical chest pain  Gastroesophageal reflux disease, unspecified whether esophagitis present    Rx / DC Orders ED Discharge Orders         Ordered    ondansetron (ZOFRAN ODT) 4 MG disintegrating tablet  Every 8 hours PRN     08/02/19 0540    sucralfate (CARAFATE) 1 GM/10ML suspension  3 times daily with meals & bedtime     08/02/19 0540           Halia Franey, Barbette Hair, MD 08/02/19 [phone]

## 2019-08-02 NOTE — ED Triage Notes (Signed)
Pt seen last night for same sx states shortness of breath , nauseated. Amb no distress noted. Vss.

## 2020-03-24 ENCOUNTER — Other Ambulatory Visit: Payer: BC Managed Care – PPO

## 2020-03-24 ENCOUNTER — Other Ambulatory Visit: Payer: Self-pay

## 2020-03-24 DIAGNOSIS — Z20822 Contact with and (suspected) exposure to covid-19: Secondary | ICD-10-CM

## 2020-03-25 LAB — NOVEL CORONAVIRUS, NAA: SARS-CoV-2, NAA: DETECTED — AB

## 2020-03-27 ENCOUNTER — Telehealth: Payer: Self-pay | Admitting: Nurse Practitioner

## 2020-03-27 NOTE — Telephone Encounter (Signed)
Called to discuss with Michael Frazier about Covid symptoms and the use of casirivimab/imdevimab, a combination monoclonal antibody infusion for those with mild to moderate Covid symptoms and at a high risk of hospitalization.     Pt is qualified for this infusion at the [facility] infusion center due to co-morbid conditions (as indicated below)  and/or a member of an at-risk group.   Unable to reach. Mychart message sent and voicemail left.    Patient Active Problem List   Diagnosis Date Noted  . Chest pain 10/18/2013    Willette Alma, AGPCNP-BC

## 2021-02-10 IMAGING — DX DG CHEST 1V PORT
1 series · 1 of 1 positions shown · non-contrast
Comparison: 10/18/2013

CLINICAL DATA: Substernal chest pain with shortness of breath.

EXAM:
PORTABLE CHEST 1 VIEW

[chest ap]
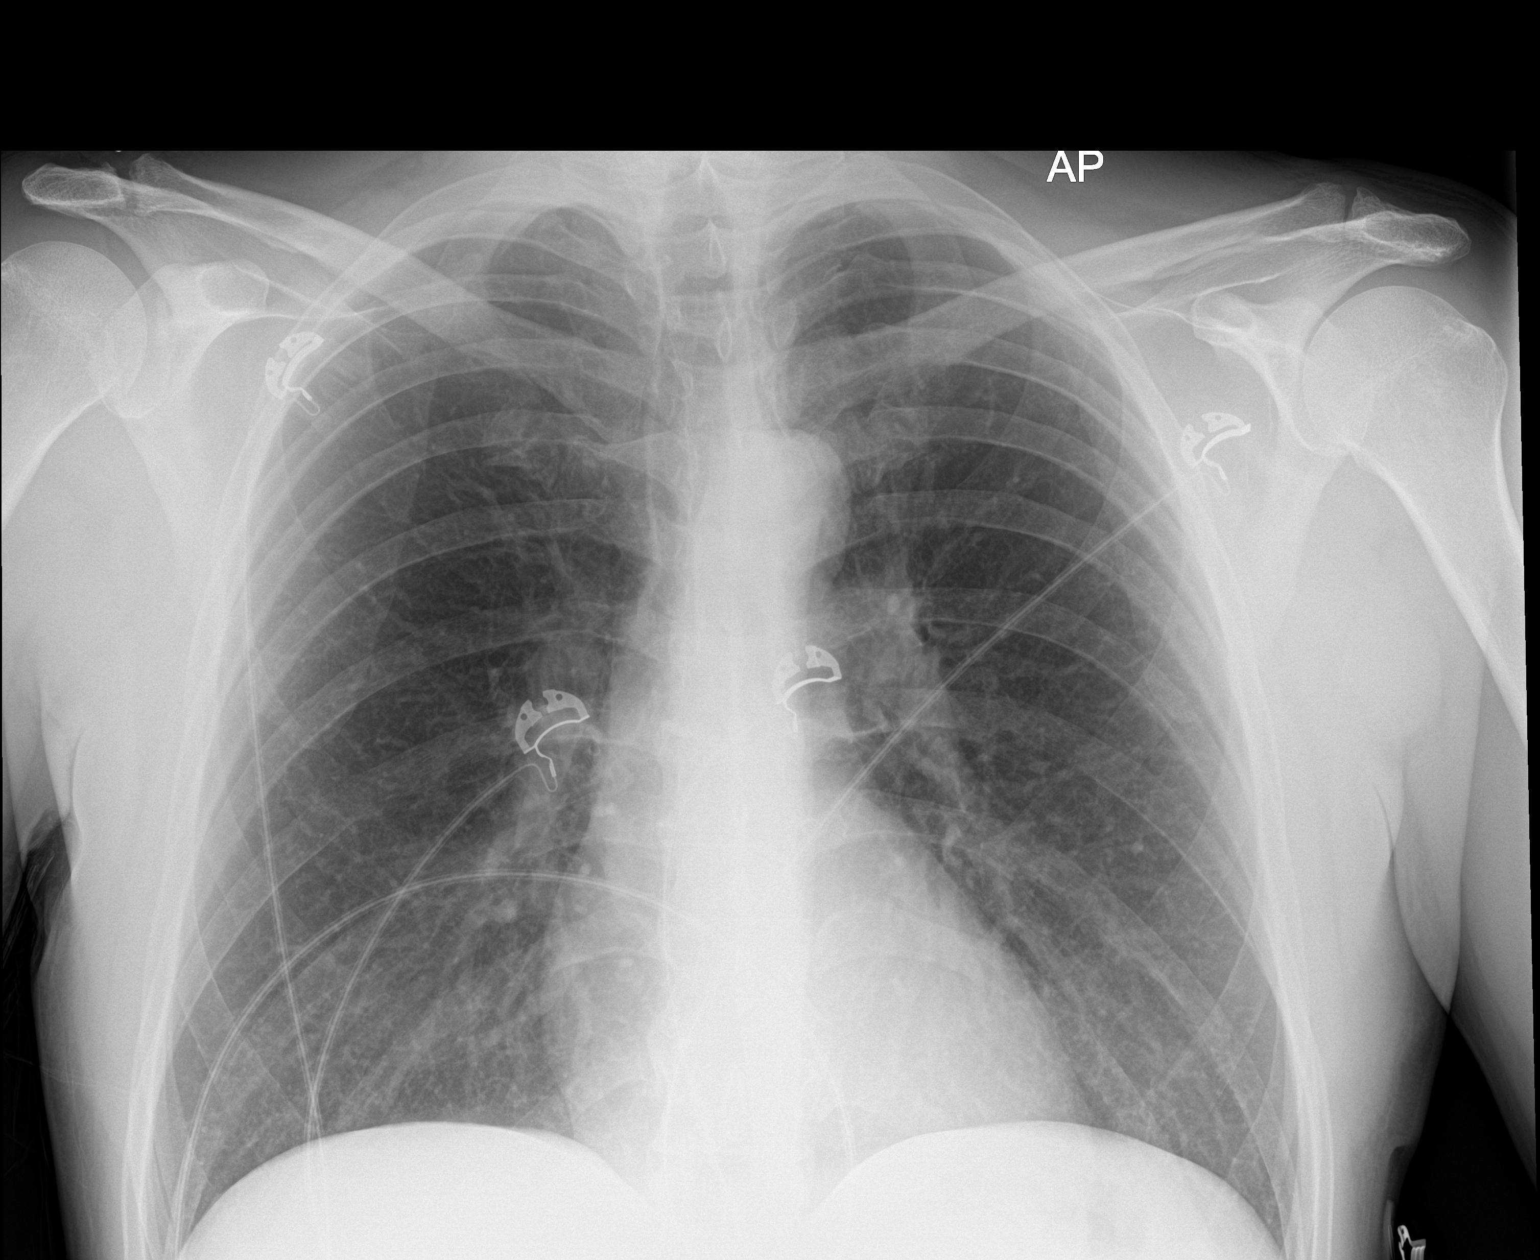

[1 of 1 positions shown; findings below may reference images not displayed]

FINDINGS: 7564 hours. The lungs are clear without focal pneumonia, edema,
pneumothorax or pleural effusion. The cardiopericardial silhouette
is within normal limits for size. The visualized bony structures of
the thorax are intact. Telemetry leads overlie the chest.
IMPRESSION: No active disease.
